# Patient Record
Sex: Male | Born: 1985 | Race: White | Hispanic: No | Marital: Married | Smoking: Former smoker
Health system: Southern US, Community
[De-identification: ages and names within clinical notes are randomized; demographics above are authoritative.]

---

## 2006-01-25 ENCOUNTER — Emergency Department: Payer: Self-pay | Admitting: Emergency Medicine

## 2007-08-09 IMAGING — CT CT ABD-PELV W/ CM
2 of 3 series · 15 of 42 positions shown, 19 images · non-contrast
Comparison: none

REASON FOR EXAM: abdominal and flank pain
COMMENTS:

[Series 2: appendicitis · axial · 0.65mm/px · z∈[-480,-90]mm · 12 of 150 slices shown, 16 images]
[im 13/150  soft-tissue]
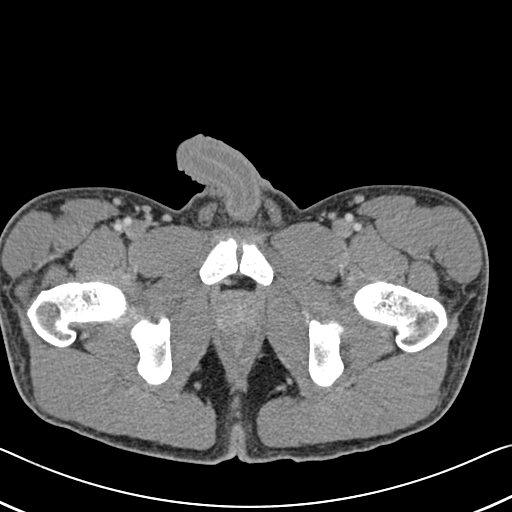
[im 13/150  bone]
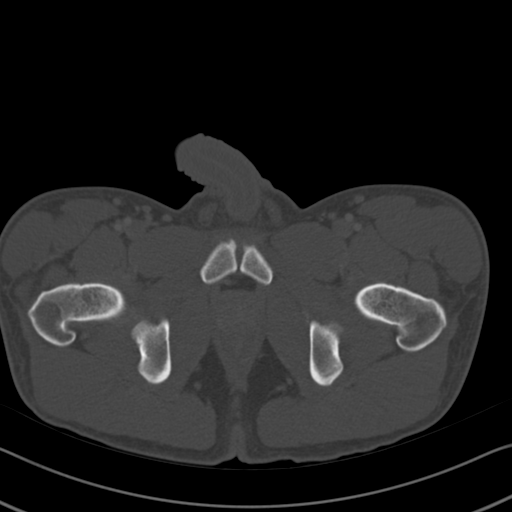
[im 26/150  soft-tissue]
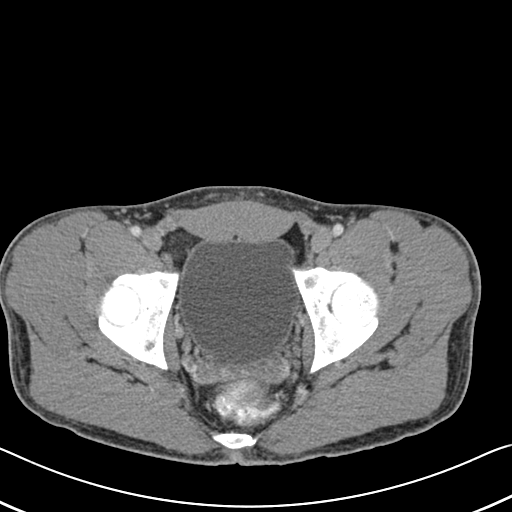
[im 39/150  soft-tissue]
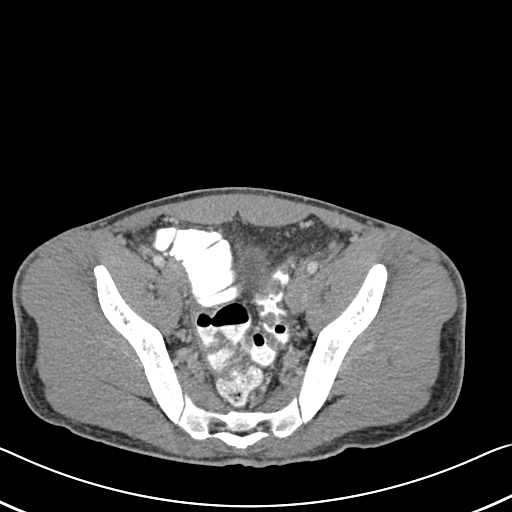
[im 52/150  soft-tissue]
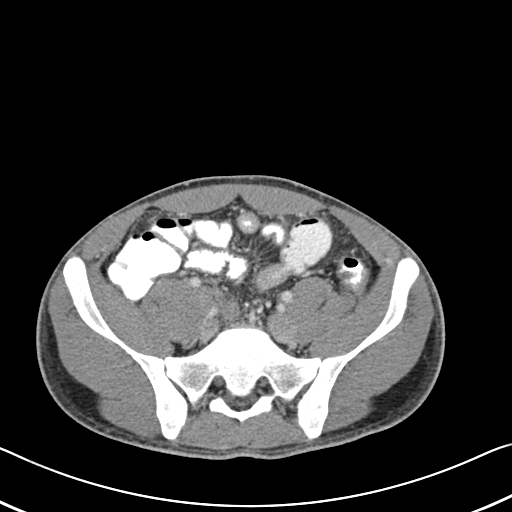
[im 65/150  soft-tissue]
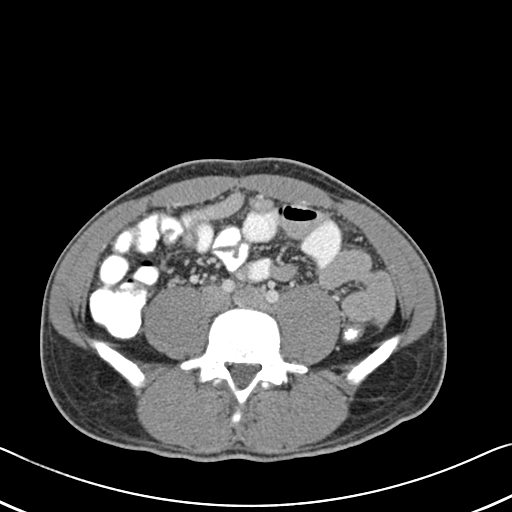
[im 85/150  soft-tissue]
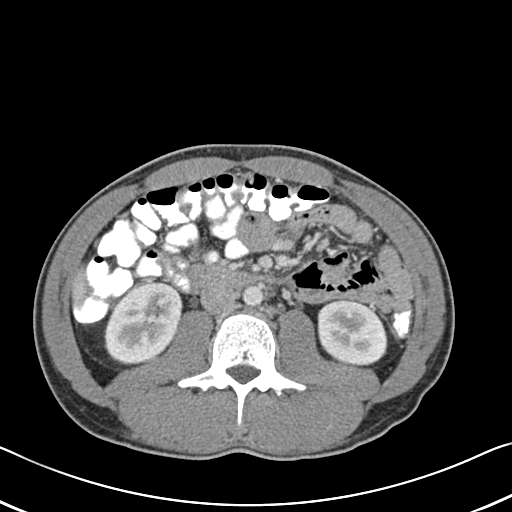
[im 98/150  soft-tissue]
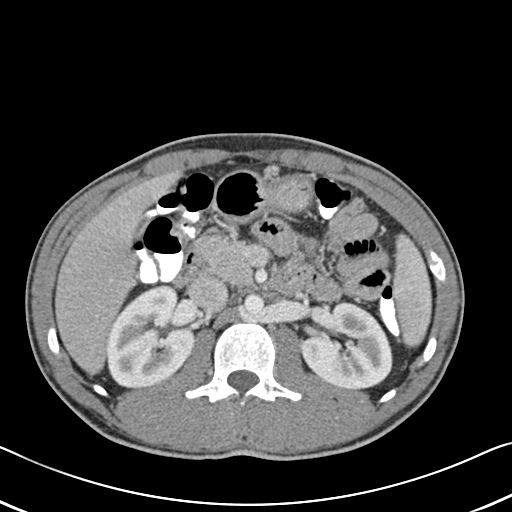
[im 111/150  soft-tissue]
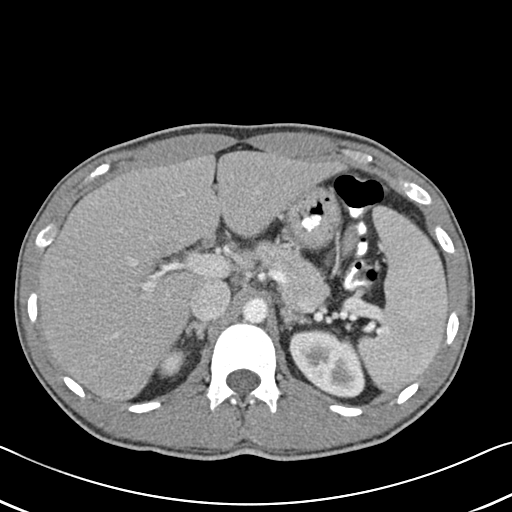
[im 124/150  soft-tissue]
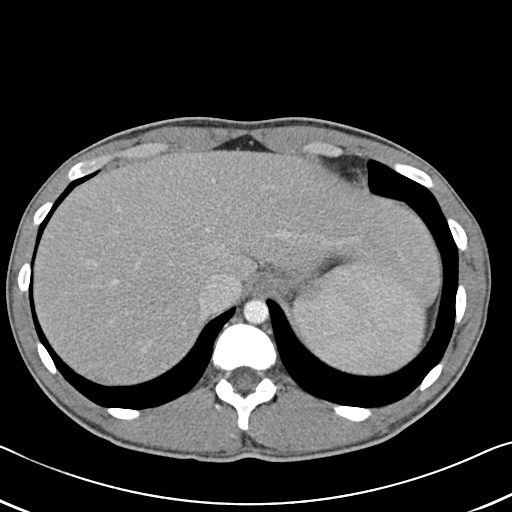
[im 124/150  lung]
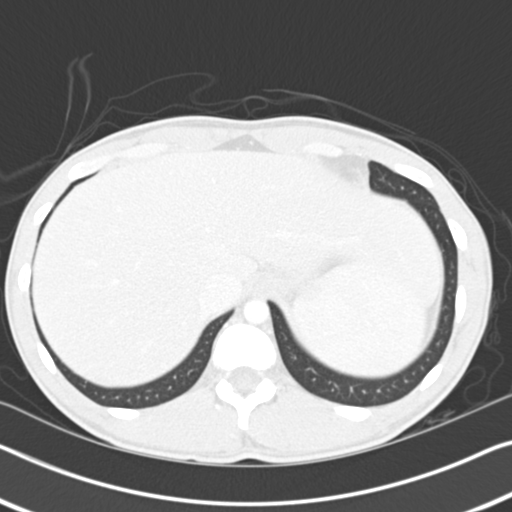
[im 124/150  bone]
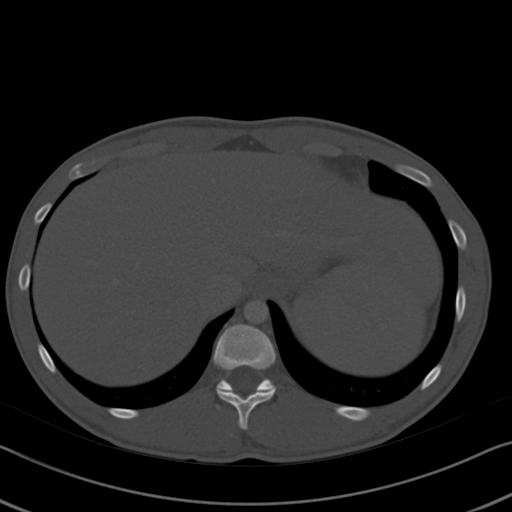
[im 130/150  lung]
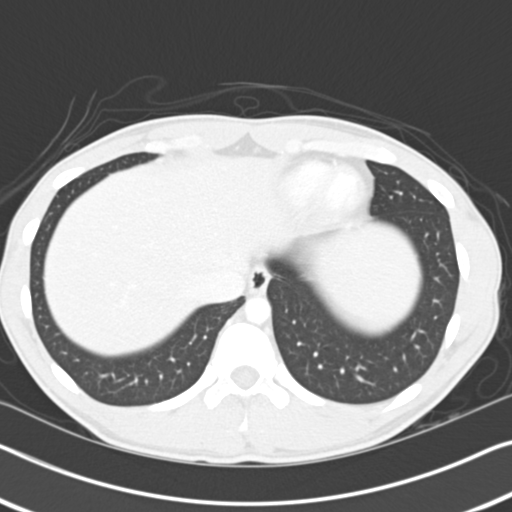
[im 137/150  soft-tissue]
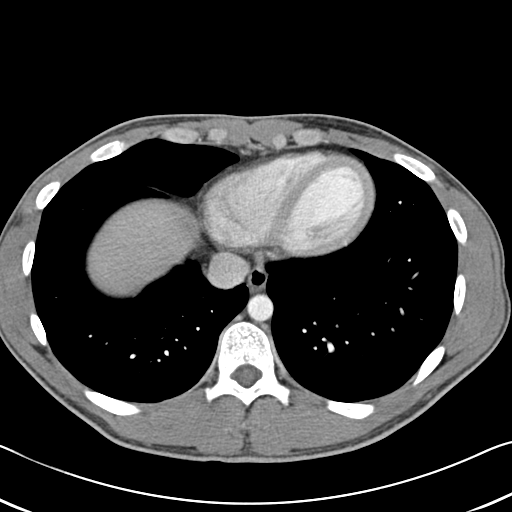
[im 137/150  lung]
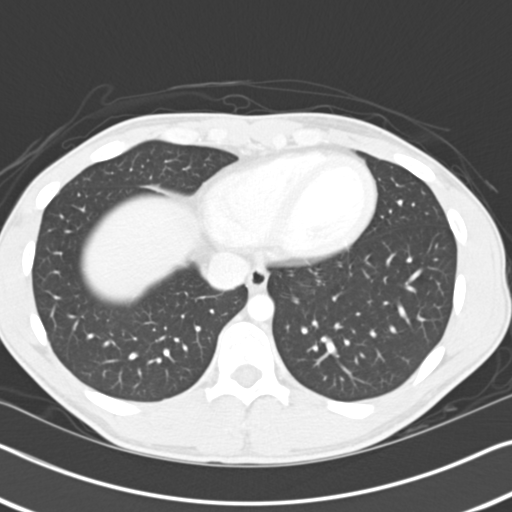
[im 143/150  lung]
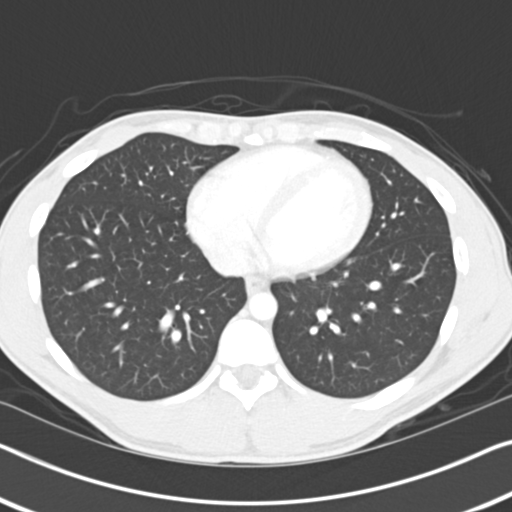

[Series 602: coronals · coronal · 0.90mm/px · 3 of 80 slices shown]
[im 27/80  soft-tissue]
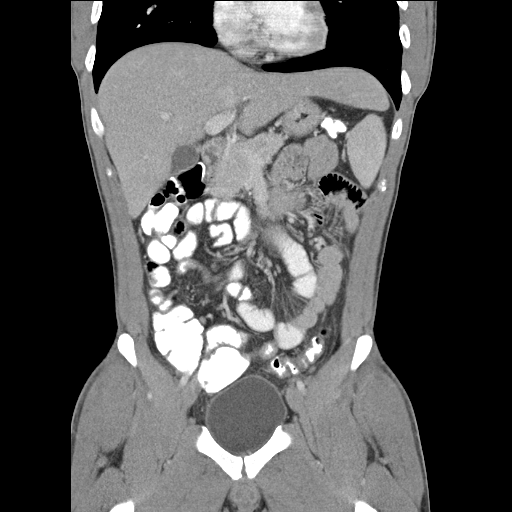
[im 36/80  soft-tissue]
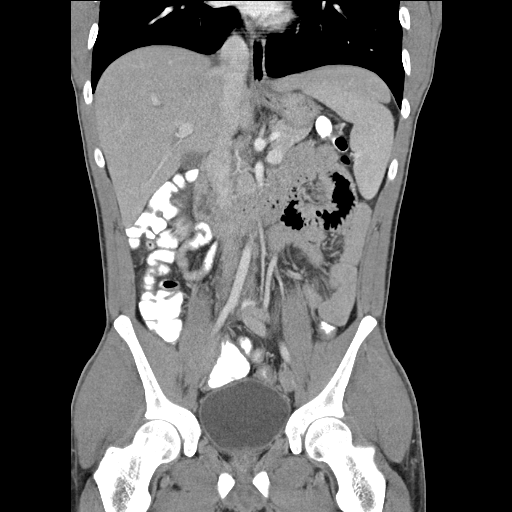
[im 44/80  soft-tissue]
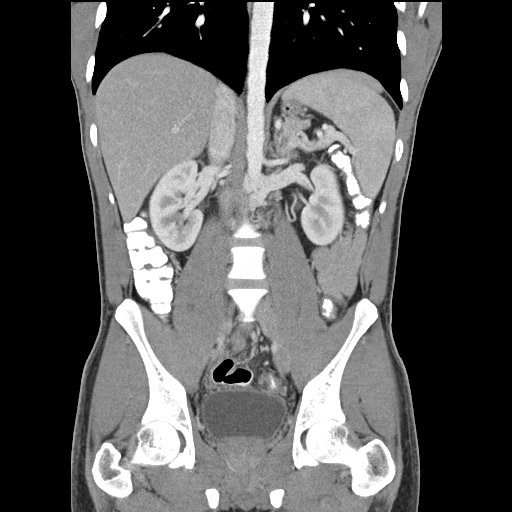

[15 of 42 positions shown; findings below may reference images not displayed]

PROCEDURE:     CT  - CT ABDOMEN / PELVIS  W  - January 25, 2006  [DATE]

RESULT:          Helical 3 mm sections were obtained from the lung bases
through the pubic symphysis status post intravenous administration of 100 ml
of Rsovue-HIV and oral contrast.

Evaluation of the lung bases demonstrates no gross abnormalities.

The liver, spleen, adrenals, pancreas, and kidneys are unremarkable.
Evaluation of the abdomen and pelvis demonstrates no evidence of free fluid
nor drainable loculated fluid collections.  The RIGHT lower quadrant
demonstrates no CT evidence to suggest sequela of appendicitis.  There does
appear to be areas of possible small bowel wall thickening which may
represent an element of enteritis.  No drainable loculated fluid
collections, masses nor adenopathy is demonstrated within the abdomen or
pelvis.
IMPRESSION: 1.     No CT evidence to suggest sequela of appendicitis.
2.     Findings possibly representing an element of enteritis as described
above.

Dr. Bayardo, of the emergency department, was informed of these findings at
the time of the initial interpretation.

## 2018-12-07 ENCOUNTER — Emergency Department (HOSPITAL_COMMUNITY)
Admission: EM | Admit: 2018-12-07 | Discharge: 2018-12-07 | Disposition: A | Discharge status: Home / Self Care | Payer: 59 | Attending: Emergency Medicine | Admitting: Emergency Medicine

## 2018-12-07 ENCOUNTER — Emergency Department (HOSPITAL_COMMUNITY): Payer: 59

## 2018-12-07 ENCOUNTER — Other Ambulatory Visit: Payer: Self-pay

## 2018-12-07 ENCOUNTER — Encounter (HOSPITAL_COMMUNITY): Payer: Self-pay

## 2018-12-07 DIAGNOSIS — J019 Acute sinusitis, unspecified: Secondary | ICD-10-CM | POA: Diagnosis not present

## 2018-12-07 DIAGNOSIS — H538 Other visual disturbances: Secondary | ICD-10-CM | POA: Diagnosis present

## 2018-12-07 LAB — COMPREHENSIVE METABOLIC PANEL
ALT: 25 U/L (ref 0–44)
AST: 24 U/L (ref 15–41)
Albumin: 4.6 g/dL (ref 3.5–5.0)
Alkaline Phosphatase: 41 U/L (ref 38–126)
Anion gap: 11 (ref 5–15)
BUN: 13 mg/dL (ref 6–20)
CO2: 22 mmol/L (ref 22–32)
Calcium: 9.5 mg/dL (ref 8.9–10.3)
Chloride: 106 mmol/L (ref 98–111)
Creatinine, Ser: 0.94 mg/dL (ref 0.61–1.24)
GFR calc Af Amer: >60 mL/min (ref >60)
GFR calc non Af Amer: >60 mL/min (ref >60)
Glucose, Bld: 118 mg/dL — ABNORMAL HIGH (ref 70–99)
Potassium: 3.9 mmol/L (ref 3.5–5.1)
Sodium: 139 mmol/L (ref 135–145)
Total Bilirubin: 0.8 mg/dL (ref 0.3–1.2)
Total Protein: 7.4 g/dL (ref 6.5–8.1)

## 2018-12-07 LAB — CBC
HCT: 45.1 % (ref 39.0–52.0)
Hemoglobin: 15.8 g/dL (ref 13.0–17.0)
MCH: 31.2 pg (ref 26.0–34.0)
MCHC: 35 g/dL (ref 30.0–36.0)
MCV: 89 fL (ref 80.0–100.0)
Platelets: 223 10*3/uL (ref 150–400)
RBC: 5.07 MIL/uL (ref 4.22–5.81)
RDW: 11.9 % (ref 11.5–15.5)
WBC: 7.4 10*3/uL (ref 4.0–10.5)
nRBC: 0 % (ref 0.0–0.2)

## 2018-12-07 MED ORDER — AMOXICILLIN-POT CLAVULANATE 875-125 MG PO TABS: 1.0000 | ORAL_TABLET | Freq: Two times a day (BID) | ORAL | 0 refills | Status: DC

## 2018-12-07 NOTE — ED Provider Notes (Signed)
[facility]PART[facility] Provider Note   CSN: [id number] Arrival date & time: 12/07/18  1354    History   Chief Complaint Chief Complaint  Patient presents with  . Anxiety  . Visual Field Change    HPI Travis Conner Travis Schappert. is a 33 y.o. male who presents to ED for left visual field deficit that occurred about 4hrs ago and lasted about 30 minutes.  States that he was driving when all of a sudden he felt anxious.  He began experiencing paresthesias, felt like there was "black" on his left peripheral vision.  This resolved spontaneously after 30 minutes.  He also had a tight band sensation in the back of his head.  States that "I would not have thought anything of it except my dad had a stroke when he was young."  Patient denies history of similar symptoms in the past, headache, vision changes currently.  Denies any neck pain, loss of consciousness, vomiting, changes to activity or appetite, diarrhea or urinary symptoms.     HPI  History reviewed. No pertinent past medical history.  There are no active problems to display for this patient.   History reviewed. No pertinent surgical history.      Home Medications    Prior to Admission medications   Medication Sig Start Date End Date Taking? Authorizing Provider  ibuprofen (ADVIL) 200 MG tablet Take 400 mg by mouth every 6 (six) hours as needed for moderate pain.   Yes [provider]  amoxicillin-clavulanate (AUGMENTIN) 875-125 MG tablet Take 1 tablet by mouth every 12 (twelve) hours. 12/07/18   Delia Heady, PA-C    Family History No family history on file.  Social History Social History   Tobacco Use  . Smoking status: Not on file  Substance Use Topics  . Alcohol use: Not on file  . Drug use: Not on file     Allergies   Patient has no allergy information on record.   Review of Systems Review of Systems  Constitutional: Negative for appetite change, chills and fever.  HENT:  Negative for ear pain, rhinorrhea, sneezing and sore throat.   Eyes: Positive for visual disturbance. Negative for photophobia.  Respiratory: Negative for cough, chest tightness, shortness of breath and wheezing.   Cardiovascular: Negative for chest pain and palpitations.  Gastrointestinal: Negative for abdominal pain, blood in stool, constipation, diarrhea, nausea and vomiting.  Genitourinary: Negative for dysuria, hematuria and urgency.  Musculoskeletal: Negative for myalgias.  Skin: Negative for rash.  Neurological: Negative for dizziness, weakness and light-headedness.     Physical Exam Updated Vital Signs BP (!) 148/86   Pulse 80   Temp 99 F (37.2 C) (Oral)   Resp 18   SpO2 96%   Physical Exam Vitals signs and nursing note reviewed.  Constitutional:      General: He is not in acute distress.    Appearance: He is well-developed.  HENT:     Head: Normocephalic and atraumatic.     Nose: Nose normal.  Eyes:     General: No scleral icterus.       Right eye: No discharge.        Left eye: No discharge.     Conjunctiva/sclera: Conjunctivae normal.     Pupils: Pupils are equal, round, and reactive to light.  Neck:     Musculoskeletal: Normal range of motion and neck supple.     Comments: No meningismus. Cardiovascular:     Rate and Rhythm: Normal rate  and regular rhythm.     Heart sounds: Normal heart sounds. No murmur. No friction rub. No gallop.   Pulmonary:     Effort: Pulmonary effort is normal. No respiratory distress.     Breath sounds: Normal breath sounds.  Abdominal:     General: Bowel sounds are normal. There is no distension.     Palpations: Abdomen is soft.     Tenderness: There is no abdominal tenderness. There is no guarding.  Musculoskeletal: Normal range of motion.  Skin:    General: Skin is warm and dry.     Findings: No rash.  Neurological:     General: No focal deficit present.     Mental Status: He is alert and oriented to person, place, and  time.     Cranial Nerves: No cranial nerve deficit.     Sensory: No sensory deficit.     Motor: No weakness or abnormal muscle tone.     Coordination: Coordination normal.     Comments: Pupils reactive. No facial asymmetry noted. Cranial nerves appear grossly intact. Sensation intact to light touch on face, BUE and BLE. Strength 5/5 in BUE and BLE. Normal finger to nose coordination bilaterally.      ED Treatments / Results  Labs (all labs ordered are listed, but only abnormal results are displayed) Labs Reviewed  COMPREHENSIVE METABOLIC PANEL - Abnormal; Notable for the following components:      Result Value   Glucose, Bld 118 (*)    All other components within normal limits  CBC    EKG None  Radiology Ct Head Wo Contrast  Result Date: 12/07/2018 CLINICAL DATA:  Transient peripheral vision blurriness on the left today. Increased anxiety. Increased stress lately. EXAM: CT HEAD WITHOUT CONTRAST TECHNIQUE: Contiguous axial images were obtained from the base of the skull through the vertex without intravenous contrast. COMPARISON:  None. FINDINGS: Brain: Normal appearing cerebral hemispheres and posterior fossa structures. Normal size and position of the ventricles. No intracranial hemorrhage, mass lesion or CT evidence of acute infarction. Vascular: No hyperdense vessel or unexpected calcification. Skull: Normal. Negative for fracture or focal lesion. Sinuses/Orbits: Mild left maxillary sinus mucosal thickening and retention cyst formation. Unremarkable orbits. Other: None. IMPRESSION: 1. No intracranial abnormality. 2. Mild chronic left maxillary sinusitis. Electronically Signed   By: Beckie SaltsSteven  Reid M.D.   On: 12/07/2018 18:41    Procedures Procedures (including critical care time)  Medications Ordered in ED Medications - No data to display   Initial Impression / Assessment and Plan / ED Course  I have reviewed the triage vital signs and the nursing notes.  Pertinent labs &  imaging results that were available during my care of the patient were reviewed by me and considered in my medical decision making (see chart for details).        33 year old male presents to ED for visual field disturbance that occurred prior to arrival.  States that he had brief loss of vision in his left peripheral eye as well as some anxiety symptoms.  Symptoms resolved spontaneously after 30 minutes.  He does admit to increased stress dealing with his children and work.  On my exam he is overall well-appearing.  No deficits neurological exam noted.  No changes to sensation or visual field deficits noted.  He is otherwise healthy, no alcohol or drug use.  Lab work including CBC, BMP is unremarkable.  CT of the head is negative for acute abnormality, does show left maxillary sinusitis which he states  has been bothering him.  Due to his absence of symptoms now, low risk for CVA suspect that symptoms were due to anxiety.  There are no headache characteristics that are lateralizing or concerning for increased ICP, infectious or vascular cause of his symptoms.   Will treat with antibiotics, have him follow-up with PCP and return for worsening symptoms.  Patient is hemodynamically stable, in NAD, and able to ambulate in the ED. Evaluation does not show pathology that would require ongoing emergent intervention or inpatient treatment. I explained the diagnosis to the patient. Pain has been managed and has no complaints prior to discharge. Patient is comfortable with above plan and is stable for discharge at this time. All questions were answered prior to disposition. Strict return precautions for returning to the ED were discussed. Encouraged follow up with PCP.   An After Visit Summary was printed and given to the patient.   Portions of this note were generated with Scientist, clinical (histocompatibility and immunogenetics)Dragon dictation software. Dictation errors may occur despite best attempts at proofreading.  Final Clinical Impressions(s) / ED Diagnoses    Final diagnoses:  Acute non-recurrent sinusitis, unspecified location    ED Discharge Orders         Ordered    amoxicillin-clavulanate (AUGMENTIN) 875-125 MG tablet  Every 12 hours     12/07/18 1851           Dietrich PatesKhatri, Kidus Delman, PA-C 12/07/18 1854    Geoffery Lyonselo, Douglas, MD 12/07/18 [phone]

## 2018-12-07 NOTE — ED Notes (Signed)
Patient verbalizes understanding of discharge instructions. Opportunity for questioning and answers were provided. Armband removed by staff, pt discharged from ED.  

## 2018-12-07 NOTE — ED Triage Notes (Signed)
Pt reports around 11am while driving he felt really anxious, pt has had increased stress with work lately. Pt also felt like his left eye peripheral vision was blurry. Symptoms have now resolved, pt very anxious in triage, tearful. Pt states his father had a stroke at a young age. Pt a.o, no neuro deficits noted. VSS

## 2018-12-07 NOTE — Discharge Instructions (Addendum)
Begin taking the antibiotics as directed. Return to the ED if you start to experience worsening symptoms, increased vision changes, vomiting, numbness in arms or legs.

## 2019-05-13 ENCOUNTER — Other Ambulatory Visit: Payer: Self-pay

## 2019-05-13 ENCOUNTER — Ambulatory Visit: Payer: 59 | Admitting: Primary Care

## 2019-05-13 ENCOUNTER — Encounter: Payer: Self-pay | Admitting: Primary Care

## 2019-05-13 VITALS — BP 124/78 | HR 82 | Temp 97.1°F | Ht 65.0 in | Wt 149.5 lb

## 2019-05-13 DIAGNOSIS — F419 Anxiety disorder, unspecified: Secondary | ICD-10-CM | POA: Diagnosis not present

## 2019-05-13 NOTE — Progress Notes (Signed)
Subjective:    Patient ID: Travis Donath Talbert Cage., male    DOB: [birth year], 33 y.o.   MRN: [id number]  HPI  This visit occurred during the SARS-CoV-2 public health emergency.  Safety protocols were in place, including screening questions prior to the visit, additional usage of staff PPE, and extensive cleaning of exam room while observing appropriate contact time as indicated for disinfecting solutions.   Travis Conner is a 33 year old male who presents today to establish care and discuss the problems mentioned below. Will obtain/review records.  1) Anxiety: Symptoms include "uncertainty", second guessing things, mind racing thoughts, worry, some jittery feeling. Symptoms began in July 2020 and have been intermittent since. Symptoms "hit" once weekly or once every other week and range in intensity, will last for minutes to 1-2 hours. He has no prior history of anxiety. He denies increased stress at work. Denies depression.   GAD 7 score of 5 and PHQ 9 score of 1 today.   BP Readings from Last 3 Encounters:  05/13/19 124/78  12/07/18 (!) 148/86   2) Family History of Celiac Disease: Diagnosed in his sister. He eats gluten products and doesn't seem to have problems. Denies GI upset, nausea, vomiting.   Review of Systems  Gastrointestinal: Negative for abdominal pain, nausea and vomiting.  Psychiatric/Behavioral:       See HPI       History reviewed. No pertinent past medical history.   Social History   Socioeconomic History  . Marital status: Married    Spouse name: Not on file  . Number of children: Not on file  . Years of education: Not on file  . Highest education level: Not on file  Occupational History  . Not on file  Tobacco Use  . Smoking status: Former Games developer  . Smokeless tobacco: Current User    Types: Snuff  Substance and Sexual Activity  . Alcohol use: Yes  . Drug use: Not on file  . Sexual activity: Not on file  Other Topics Concern  . Not on file  Social  History Narrative   Married.      Social Determinants of Health   Financial Resource Strain:   . Difficulty of Paying Living Expenses: Not on file  Food Insecurity:   . Worried About Programme researcher, broadcasting/film/video in the Last Year: Not on file  . Ran Out of Food in the Last Year: Not on file  Transportation Needs:   . Lack of Transportation (Medical): Not on file  . Lack of Transportation (Non-Medical): Not on file  Physical Activity:   . Days of Exercise per Week: Not on file  . Minutes of Exercise per Session: Not on file  Stress:   . Feeling of Stress : Not on file  Social Connections:   . Frequency of Communication with Friends and Family: Not on file  . Frequency of Social Gatherings with Friends and Family: Not on file  . Attends Religious Services: Not on file  . Active Member of Clubs or Organizations: Not on file  . Attends Banker Meetings: Not on file  . Marital Status: Not on file  Intimate Partner Violence:   . Fear of Current or Ex-Partner: Not on file  . Emotionally Abused: Not on file  . Physically Abused: Not on file  . Sexually Abused: Not on file    History reviewed. No pertinent surgical history.  Family History  Problem Relation Age of Onset  . Stroke  Father   . Asthma Sister   . Celiac disease Sister   . Heart attack Maternal Grandmother   . Heart attack Maternal Grandfather   . Diabetes Paternal Grandmother   . Colon cancer Paternal Grandfather     No Known Allergies  No current outpatient medications on file prior to visit.   No current facility-administered medications on file prior to visit.    BP 124/78   Pulse 82   Temp (!) 97.1 F (36.2 C) (Temporal)   Ht 5\' 5"  (1.651 m)   Wt 149 lb 8 oz (67.8 kg)   SpO2 98%   BMI 24.88 kg/m    Objective:   Physical Exam  Constitutional: He appears well-nourished.  Cardiovascular: Normal rate and regular rhythm.  Respiratory: Effort normal and breath sounds normal.  Musculoskeletal:       Cervical back: Neck supple.  Skin: Skin is warm and dry.  Psychiatric: He has a normal mood and affect.           Assessment & Plan:

## 2019-05-13 NOTE — Patient Instructions (Signed)
You will be contacted regarding your referral to therapy.  Please let us know if you have not been contacted within two weeks.   It was a pleasure to meet you today! Please don't hesitate to call or message me with any questions. Welcome to [facility]!   

## 2019-05-13 NOTE — Assessment & Plan Note (Signed)
Intermittent since July 2020, seems to be having mild to moderate panic attacks. Discussed treatment options which mainly include therapy vs PRN hydroxyzine.  He opts for therapy which is a very reasonable start. Referral placed. Denies depression.

## 2019-06-01 ENCOUNTER — Other Ambulatory Visit: Payer: Self-pay | Admitting: Primary Care

## 2019-06-01 DIAGNOSIS — Z0000 Encounter for general adult medical examination without abnormal findings: Secondary | ICD-10-CM

## 2019-06-07 ENCOUNTER — Telehealth: Payer: Self-pay

## 2019-06-07 NOTE — Telephone Encounter (Signed)
LVM to call clinic, pt needs COVID screen and back lab info 1.12.2021 TLJ  

## 2019-06-08 ENCOUNTER — Other Ambulatory Visit: Payer: Self-pay

## 2019-06-08 ENCOUNTER — Other Ambulatory Visit (INDEPENDENT_AMBULATORY_CARE_PROVIDER_SITE_OTHER): Payer: 59

## 2019-06-08 DIAGNOSIS — Z0000 Encounter for general adult medical examination without abnormal findings: Secondary | ICD-10-CM | POA: Diagnosis not present

## 2019-06-08 LAB — COMPREHENSIVE METABOLIC PANEL
ALT: 19 U/L (ref 0–53)
AST: 18 U/L (ref 0–37)
Albumin: 4.7 g/dL (ref 3.5–5.2)
Alkaline Phosphatase: 39 U/L (ref 39–117)
BUN: 17 mg/dL (ref 6–23)
CO2: 30 mEq/L (ref 19–32)
Calcium: 9.5 mg/dL (ref 8.4–10.5)
Chloride: 103 mEq/L (ref 96–112)
Creatinine, Ser: 0.98 mg/dL (ref 0.40–1.50)
GFR: 86.88 mL/min (ref >60.00)
Glucose, Bld: 103 mg/dL — ABNORMAL HIGH (ref 70–99)
Potassium: 3.9 mEq/L (ref 3.5–5.1)
Sodium: 140 mEq/L (ref 135–145)
Total Bilirubin: 0.6 mg/dL (ref 0.2–1.2)
Total Protein: 7.3 g/dL (ref 6.0–8.3)

## 2019-06-08 LAB — HEMOGLOBIN A1C: Hgb A1c MFr Bld: 5.1 % (ref 4.6–6.5)

## 2019-06-08 LAB — LIPID PANEL
Cholesterol: 207 mg/dL — ABNORMAL HIGH (ref 0–200)
HDL: 52.9 mg/dL (ref >39.00)
LDL Cholesterol: 139 mg/dL — ABNORMAL HIGH (ref 0–99)
NonHDL: 154.04
Total CHOL/HDL Ratio: 4
Triglycerides: 76 mg/dL (ref 0.0–149.0)
VLDL: 15.2 mg/dL (ref 0.0–40.0)

## 2019-06-15 ENCOUNTER — Encounter: Payer: Self-pay | Admitting: Primary Care

## 2019-06-15 ENCOUNTER — Ambulatory Visit (INDEPENDENT_AMBULATORY_CARE_PROVIDER_SITE_OTHER): Payer: 59 | Admitting: Primary Care

## 2019-06-15 ENCOUNTER — Other Ambulatory Visit: Payer: Self-pay

## 2019-06-15 VITALS — BP 120/76 | HR 85 | Temp 97.1°F | Ht 65.0 in | Wt 147.5 lb

## 2019-06-15 DIAGNOSIS — Z8379 Family history of other diseases of the digestive system: Secondary | ICD-10-CM

## 2019-06-15 DIAGNOSIS — Z0000 Encounter for general adult medical examination without abnormal findings: Secondary | ICD-10-CM | POA: Diagnosis not present

## 2019-06-15 DIAGNOSIS — R002 Palpitations: Secondary | ICD-10-CM

## 2019-06-15 DIAGNOSIS — Z23 Encounter for immunization: Secondary | ICD-10-CM | POA: Diagnosis not present

## 2019-06-15 DIAGNOSIS — E785 Hyperlipidemia, unspecified: Secondary | ICD-10-CM

## 2019-06-15 DIAGNOSIS — F419 Anxiety disorder, unspecified: Secondary | ICD-10-CM | POA: Diagnosis not present

## 2019-06-15 NOTE — Patient Instructions (Addendum)
Stop by the lab prior to leaving today. I will notify you of your results once received.   Follow up with the therapist as scheduled.  Continue exercising. You should be getting 150 minutes of moderate intensity exercise weekly.  Continue to work on a healthy diet. Ensure you are consuming 64 ounces of water daily.  Please update me as disuseded.  It was a pleasure to see you today!   Preventive Care 52-36 Years Old, Male Preventive care refers to lifestyle choices and visits with your health care provider that can promote health and wellness. This includes:  A yearly physical exam. This is also called an annual well check.  Regular dental and eye exams.  Immunizations.  Screening for certain conditions.  Healthy lifestyle choices, such as eating a healthy diet, getting regular exercise, not using drugs or products that contain nicotine and tobacco, and limiting alcohol use. What can I expect for my preventive care visit? Physical exam Your health care provider will check:  Height and weight. These may be used to calculate body mass index (BMI), which is a measurement that tells if you are at a healthy weight.  Heart rate and blood pressure.  Your skin for abnormal spots. Counseling Your health care provider may ask you questions about:  Alcohol, tobacco, and drug use.  Emotional well-being.  Home and relationship well-being.  Sexual activity.  Eating habits.  Work and work Statistician. What immunizations do I need?  Influenza (flu) vaccine  This is recommended every year. Tetanus, diphtheria, and pertussis (Tdap) vaccine  You may need a Td booster every 10 years. Varicella (chickenpox) vaccine  You may need this vaccine if you have not already been vaccinated. Human papillomavirus (HPV) vaccine  If recommended by your health care provider, you may need three doses over 6 months. Measles, mumps, and rubella (MMR) vaccine  You may need at least one dose  of MMR. You may also need a second dose. Meningococcal conjugate (MenACWY) vaccine  One dose is recommended if you are 9-67 years old and a Market researcher living in a residence hall, or if you have one of several medical conditions. You may also need additional booster doses. Pneumococcal conjugate (PCV13) vaccine  You may need this if you have certain conditions and were not previously vaccinated. Pneumococcal polysaccharide (PPSV23) vaccine  You may need one or two doses if you smoke cigarettes or if you have certain conditions. Hepatitis A vaccine  You may need this if you have certain conditions or if you travel or work in places where you may be exposed to hepatitis A. Hepatitis B vaccine  You may need this if you have certain conditions or if you travel or work in places where you may be exposed to hepatitis B. Haemophilus influenzae type b (Hib) vaccine  You may need this if you have certain risk factors. You may receive vaccines as individual doses or as more than one vaccine together in one shot (combination vaccines). Talk with your health care provider about the risks and benefits of combination vaccines. What tests do I need? Blood tests  Lipid and cholesterol levels. These may be checked every 5 years starting at age 64.  Hepatitis C test.  Hepatitis B test. Screening   Diabetes screening. This is done by checking your blood sugar (glucose) after you have not eaten for a while (fasting).  Sexually transmitted disease (STD) testing. Talk with your health care provider about your test results, treatment options, and if  necessary, the need for more tests. Follow these instructions at home: Eating and drinking   Eat a diet that includes fresh fruits and vegetables, whole grains, lean protein, and low-fat dairy products.  Take vitamin and mineral supplements as recommended by your health care provider.  Do not drink alcohol if your health care provider  tells you not to drink.  If you drink alcohol: ? Limit how much you have to 0-2 drinks a day. ? Be aware of how much alcohol is in your drink. In the [place] one drink equals one 12 oz bottle of beer (355 mL), one 5 oz glass of wine (148 mL), or one 1 oz glass of hard liquor (44 mL). Lifestyle  Take daily care of your teeth and gums.  Stay active. Exercise for at least 30 minutes on 5 or more days each week.  Do not use any products that contain nicotine or tobacco, such as cigarettes, e-cigarettes, and chewing tobacco. If you need help quitting, ask your health care provider.  If you are sexually active, practice safe sex. Use a condom or other form of protection to prevent STIs (sexually transmitted infections). What's next?  Go to your health care provider once a year for a well check visit.  Ask your health care provider how often you should have your eyes and teeth checked.  Stay up to date on all vaccines. This information is not intended to replace advice given to you by your health care provider. Make sure you discuss any questions you have with your health care provider. Document Revised: 05/06/2018 Document Reviewed: 05/06/2018 Elsevier Patient Education  2020 [employer].

## 2019-06-15 NOTE — Assessment & Plan Note (Signed)
Tetanus due, provided. Declines influenza vaccination. Encouraged a healthy diet, regular exercise. Exam today benign. Labs reviewed.

## 2019-06-15 NOTE — Assessment & Plan Note (Signed)
Present in his sister. He is wanting testing, has no difficulty with gluten products. Testing pending.

## 2019-06-15 NOTE — Addendum Note (Signed)
Addended by: Tawnya Crook on: 06/15/2019 04:29 PM   Modules accepted: Orders

## 2019-06-15 NOTE — Assessment & Plan Note (Signed)
Suspect more anxiety related as they occur during mind racing thoughts.  Check TSH, CBC today. ECG today with NSR rate of 78, no PAC/PVC, acute St changes. No prior ECG to compare.  Continue with therapy for anxiety treatment. Consider cardiology for holter monitor evaluation.

## 2019-06-15 NOTE — Progress Notes (Signed)
Subjective:    Patient ID: Travis Conner Travis Conner., male    DOB: 07-16-1985, 34 y.o.   MRN: [id number]  HPI  This visit occurred during the SARS-CoV-2 public health emergency.  Safety protocols were in place, including screening questions prior to the visit, additional usage of staff PPE, and extensive cleaning of exam room while observing appropriate contact time as indicated for disinfecting solutions.   [name] is a 34 year old male who presents today for complete physical.  He would also like to discuss anxiety. Symptoms include substernal chest pressure. His pressure is intermittent which feels like a "squeezing". He'll then notice an "adrenaline rush". He has to avoid high intensity work outs he will notice palpitations, lightheadedness, shortness of breath, and anxiety. He denies syncope, near syncope. Symptoms initially began in Summer 2020, evaluated in the ED. He has chronic anxiety on a daily basis.   He would also like a Celiac Panel as his sister has Celiac disease.   Immunizations: -Tetanus: Unsure, over 10 years. -Influenza: Declines   Diet: He endorses a fair diet. Exercise: He is exercising some days weekly  Eye exam: No recent exam Dental exam: Completes semi-annually  BP Readings from Last 3 Encounters:  06/15/19 120/76  05/13/19 124/78  12/07/18 (!) 148/86       Review of Systems  Constitutional: Negative for unexpected weight change.  HENT: Negative for rhinorrhea.   Respiratory: Negative for cough and shortness of breath.   Cardiovascular: Positive for palpitations. Negative for chest pain.       See HPI  Gastrointestinal: Negative for abdominal pain, constipation and diarrhea.  Genitourinary: Negative for difficulty urinating.  Musculoskeletal: Negative for arthralgias and myalgias.  Skin: Negative for rash.  Allergic/Immunologic: Negative for environmental allergies.  Neurological: Negative for dizziness, syncope, numbness and headaches.    Psychiatric/Behavioral: The patient is nervous/anxious.        No past medical history on file.   Social History   Socioeconomic History  . Marital status: Married    Spouse name: Not on file  . Number of children: Not on file  . Years of education: Not on file  . Highest education level: Not on file  Occupational History  . Not on file  Tobacco Use  . Smoking status: Former Research scientist (life sciences)  . Smokeless tobacco: Current User    Types: Snuff  Substance and Sexual Activity  . Alcohol use: Yes  . Drug use: Not on file  . Sexual activity: Not on file  Other Topics Concern  . Not on file  Social History Narrative   Married.      Social Determinants of Health   Financial Resource Strain:   . Difficulty of Paying Living Expenses: Not on file  Food Insecurity:   . Worried About Charity fundraiser in the Last Year: Not on file  . Ran Out of Food in the Last Year: Not on file  Transportation Needs:   . Lack of Transportation (Medical): Not on file  . Lack of Transportation (Non-Medical): Not on file  Physical Activity:   . Days of Exercise per Week: Not on file  . Minutes of Exercise per Session: Not on file  Stress:   . Feeling of Stress : Not on file  Social Connections:   . Frequency of Communication with Friends and Family: Not on file  . Frequency of Social Gatherings with Friends and Family: Not on file  . Attends Religious Services: Not on file  .  Active Member of Clubs or Organizations: Not on file  . Attends Banker Meetings: Not on file  . Marital Status: Not on file  Intimate Partner Violence:   . Fear of Current or Ex-Partner: Not on file  . Emotionally Abused: Not on file  . Physically Abused: Not on file  . Sexually Abused: Not on file    No past surgical history on file.  Family History  Problem Relation Age of Onset  . Stroke Father   . Asthma Sister   . Celiac disease Sister   . Heart attack Maternal Grandmother   . Heart attack  Maternal Grandfather   . Diabetes Paternal Grandmother   . Colon cancer Paternal Grandfather     No Known Allergies  No current outpatient medications on file prior to visit.   No current facility-administered medications on file prior to visit.    BP 120/76   Pulse 85   Temp (!) 97.1 F (36.2 C) (Temporal)   Ht 5\' 5"  (1.651 m)   Wt 147 lb 8 oz (66.9 kg)   SpO2 98%   BMI 24.55 kg/m    Objective:   Physical Exam  Constitutional: He is oriented to person, place, and time. He appears well-nourished.  HENT:  Right Ear: Tympanic membrane and ear canal normal.  Left Ear: Tympanic membrane and ear canal normal.  Mouth/Throat: Oropharynx is clear and moist.  Eyes: Pupils are equal, round, and reactive to light. EOM are normal.  Cardiovascular: Normal rate and regular rhythm.  Respiratory: Effort normal and breath sounds normal.  GI: Soft. Bowel sounds are normal. There is no abdominal tenderness.  Musculoskeletal:        General: Normal range of motion.     Cervical back: Neck supple.  Neurological: He is alert and oriented to person, place, and time. No cranial nerve deficit.  Reflex Scores:      Patellar reflexes are 2+ on the right side and 2+ on the left side. Skin: Skin is warm and dry.  Psychiatric: He has a normal mood and affect.           Assessment & Plan:

## 2019-06-15 NOTE — Assessment & Plan Note (Addendum)
Chronic and continuous, suspect this to be contributing to symptoms in HPI. He has an appointment with therapy in 2 weeks, continues to decline medication treatment.  ECG today with NSR rate of 78, no PAC/PVC, acute St changes. No prior ECG to compare.

## 2019-06-15 NOTE — Assessment & Plan Note (Signed)
LDL of 139. Given his body habitus and healthy lifestyle this is likely familial.   Continue to work on healthy lifestyle. Continue to monitor.

## 2019-06-16 LAB — CBC
HCT: 44 % (ref 39.0–52.0)
Hemoglobin: 15.1 g/dL (ref 13.0–17.0)
MCHC: 34.2 g/dL (ref 30.0–36.0)
MCV: 90.5 fl (ref 78.0–100.0)
Platelets: 203 10*3/uL (ref 150.0–400.0)
RBC: 4.87 Mil/uL (ref 4.22–5.81)
RDW: 12.7 % (ref 11.5–15.5)
WBC: 6 10*3/uL (ref 4.0–10.5)

## 2019-06-16 LAB — TSH: TSH: 1.18 u[IU]/mL (ref 0.35–4.50)

## 2019-06-20 LAB — CELIAC PNL 2 RFLX ENDOMYSIAL AB TTR
(tTG) Ab, IgA: <1 U/mL
(tTG) Ab, IgG: 4 U/mL
Endomysial Ab IgA: NEGATIVE
Gliadin IgA: 13 U (ref <20)
Gliadin IgG: 4 U (ref <20)
Immunoglobulin A: 254 mg/dL (ref 47–310)

## 2019-06-30 ENCOUNTER — Ambulatory Visit (INDEPENDENT_AMBULATORY_CARE_PROVIDER_SITE_OTHER): Payer: 59 | Admitting: Psychology

## 2019-06-30 DIAGNOSIS — F411 Generalized anxiety disorder: Secondary | ICD-10-CM | POA: Diagnosis not present

## 2019-07-04 DIAGNOSIS — F419 Anxiety disorder, unspecified: Secondary | ICD-10-CM

## 2019-07-04 MED ORDER — CITALOPRAM HYDROBROMIDE 20 MG PO TABS: 20.0000 mg | ORAL_TABLET | Freq: Every day | ORAL | 1 refills | Status: DC

## 2019-07-08 ENCOUNTER — Ambulatory Visit (INDEPENDENT_AMBULATORY_CARE_PROVIDER_SITE_OTHER): Payer: 59 | Admitting: Psychology

## 2019-07-08 DIAGNOSIS — F411 Generalized anxiety disorder: Secondary | ICD-10-CM | POA: Diagnosis not present

## 2019-07-22 ENCOUNTER — Ambulatory Visit (INDEPENDENT_AMBULATORY_CARE_PROVIDER_SITE_OTHER): Payer: 59 | Admitting: Psychology

## 2019-07-22 DIAGNOSIS — F411 Generalized anxiety disorder: Secondary | ICD-10-CM

## 2019-08-04 ENCOUNTER — Ambulatory Visit (INDEPENDENT_AMBULATORY_CARE_PROVIDER_SITE_OTHER): Payer: 59 | Admitting: Psychology

## 2019-08-04 DIAGNOSIS — F411 Generalized anxiety disorder: Secondary | ICD-10-CM

## 2019-08-18 ENCOUNTER — Other Ambulatory Visit: Payer: Self-pay

## 2019-08-18 ENCOUNTER — Ambulatory Visit (INDEPENDENT_AMBULATORY_CARE_PROVIDER_SITE_OTHER): Payer: 59 | Admitting: Primary Care

## 2019-08-18 ENCOUNTER — Encounter: Payer: Self-pay | Admitting: Primary Care

## 2019-08-18 DIAGNOSIS — F419 Anxiety disorder, unspecified: Secondary | ICD-10-CM | POA: Diagnosis not present

## 2019-08-18 DIAGNOSIS — Z8379 Family history of other diseases of the digestive system: Secondary | ICD-10-CM

## 2019-08-18 DIAGNOSIS — R002 Palpitations: Secondary | ICD-10-CM | POA: Diagnosis not present

## 2019-08-18 MED ORDER — CITALOPRAM HYDROBROMIDE 20 MG PO TABS: 20.0000 mg | ORAL_TABLET | Freq: Every day | ORAL | 3 refills | Status: DC

## 2019-08-18 NOTE — Assessment & Plan Note (Signed)
Labs negative for Celiac.

## 2019-08-18 NOTE — Progress Notes (Signed)
Subjective:    Patient ID: Travis Martens Annetta Maw., male    DOB: 1985-06-16, 34 y.o.   MRN: [id number]  HPI  This visit occurred during the SARS-CoV-2 public health emergency.  Safety protocols were in place, including screening questions prior to the visit, additional usage of staff PPE, and extensive cleaning of exam room while observing appropriate contact time as indicated for disinfecting solutions.   [name] is a 34 year old male who presents today for follow up of anxiety.   He was last evaluated in late January 2021 for symptoms of chest pressure, "adrenaline rush", palpitations. Work up in the office was without concern for cardiac cause. He agreed to treatment for anxiety so we initiated citalopram 20 mg.   Since his last visit he's doing better. He did have headaches for several weeks, these have resolved. Positive effects from citalopram include feeling less anxious, doesn't have the "adrenaline rush" feeling, is more active at home, feeling less stressed at work.   He's continued to meet with his therapist, this has been going well overall. He denies SI/HI.   Review of Systems  Gastrointestinal: Negative for nausea.  Neurological: Negative for headaches.  Psychiatric/Behavioral: The patient is not nervous/anxious.        See HPI       No past medical history on file.   Social History   Socioeconomic History  . Marital status: Married    Spouse name: Not on file  . Number of children: Not on file  . Years of education: Not on file  . Highest education level: Not on file  Occupational History  . Not on file  Tobacco Use  . Smoking status: Former Research scientist (life sciences)  . Smokeless tobacco: Current User    Types: Snuff  Substance and Sexual Activity  . Alcohol use: Yes  . Drug use: Not on file  . Sexual activity: Not on file  Other Topics Concern  . Not on file  Social History Narrative   Married.      Social Determinants of Health   Financial Resource Strain:   .  Difficulty of Paying Living Expenses:   Food Insecurity:   . Worried About Charity fundraiser in the Last Year:   . Arboriculturist in the Last Year:   Transportation Needs:   . Film/video editor (Medical):   Marland Kitchen Lack of Transportation (Non-Medical):   Physical Activity:   . Days of Exercise per Week:   . Minutes of Exercise per Session:   Stress:   . Feeling of Stress :   Social Connections:   . Frequency of Communication with Friends and Family:   . Frequency of Social Gatherings with Friends and Family:   . Attends Religious Services:   . Active Member of Clubs or Organizations:   . Attends Archivist Meetings:   Marland Kitchen Marital Status:   Intimate Partner Violence:   . Fear of Current or Ex-Partner:   . Emotionally Abused:   Marland Kitchen Physically Abused:   . Sexually Abused:     No past surgical history on file.  Family History  Problem Relation Age of Onset  . Stroke Father   . Asthma Sister   . Celiac disease Sister   . Heart attack Maternal Grandmother   . Heart attack Maternal Grandfather   . Diabetes Paternal Grandmother   . Colon cancer Paternal Grandfather     No Known Allergies  No current outpatient medications on  file prior to visit.   No current facility-administered medications on file prior to visit.    BP 124/70   Pulse 68   Temp (!) 97 F (36.1 C) (Temporal)   Ht 5\' 5"  (1.651 m)   Wt 146 lb 12 oz (66.6 kg)   SpO2 98%   BMI 24.42 kg/m    Objective:   Physical Exam  Constitutional: He appears well-nourished.  Cardiovascular: Normal rate and regular rhythm.  Respiratory: Effort normal and breath sounds normal.  Musculoskeletal:     Cervical back: Neck supple.  Skin: Skin is warm and dry.  Psychiatric: He has a normal mood and affect.           Assessment & Plan:

## 2019-08-18 NOTE — Patient Instructions (Signed)
Continue taking citalopram 20 mg daily for anxiety. I recommend you take this for at LEAST 6 months.  Please notify me if you are considering weaning off.  It was a pleasure to see you today!

## 2019-08-18 NOTE — Assessment & Plan Note (Signed)
Significantly improved with anxiety treatment, continue to monitor.

## 2019-08-18 NOTE — Assessment & Plan Note (Signed)
Much improved with citalopram 20 mg, no residual side effects. Denies SI/HI. Continue same. Refills sent to pharmacy.

## 2019-08-19 ENCOUNTER — Ambulatory Visit (INDEPENDENT_AMBULATORY_CARE_PROVIDER_SITE_OTHER): Payer: 59 | Admitting: Psychology

## 2019-08-19 DIAGNOSIS — F411 Generalized anxiety disorder: Secondary | ICD-10-CM

## 2019-09-02 ENCOUNTER — Ambulatory Visit (INDEPENDENT_AMBULATORY_CARE_PROVIDER_SITE_OTHER): Payer: 59 | Admitting: Psychology

## 2019-09-02 DIAGNOSIS — F411 Generalized anxiety disorder: Secondary | ICD-10-CM

## 2019-09-07 ENCOUNTER — Other Ambulatory Visit: Payer: Self-pay | Admitting: Primary Care

## 2019-09-07 DIAGNOSIS — F419 Anxiety disorder, unspecified: Secondary | ICD-10-CM

## 2019-09-23 ENCOUNTER — Ambulatory Visit: Payer: 59 | Admitting: Psychology

## 2020-06-20 IMAGING — CT CT HEAD WITHOUT CONTRAST
4 series · 16 of 47 positions shown, 18 images · non-contrast
Comparison: None.

CLINICAL DATA: Transient peripheral vision blurriness on the left
today. Increased anxiety. Increased stress lately.

EXAM:
CT HEAD WITHOUT CONTRAST
TECHNIQUE: Contiguous axial images were obtained from the base of the skull
through the vertex without intravenous contrast.

[Series 3: head wo · axial · 0.44mm/px · z∈[-89,+31]mm · 7 of 34 slices shown, 9 images]
[im 5/34  brain]
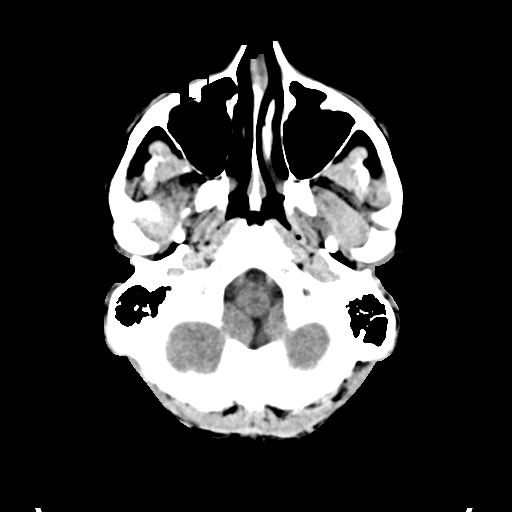
[im 5/34  bone]
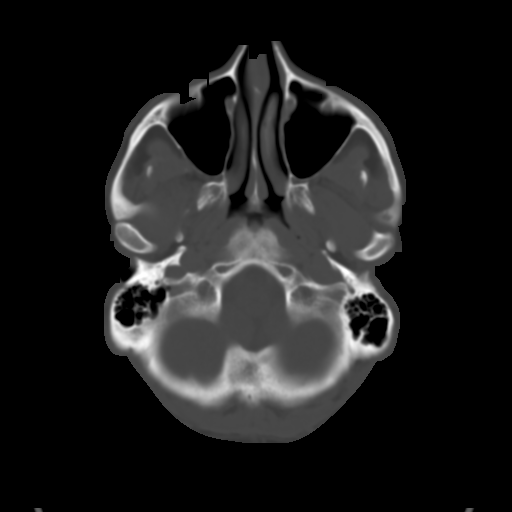
[im 9/34  brain]
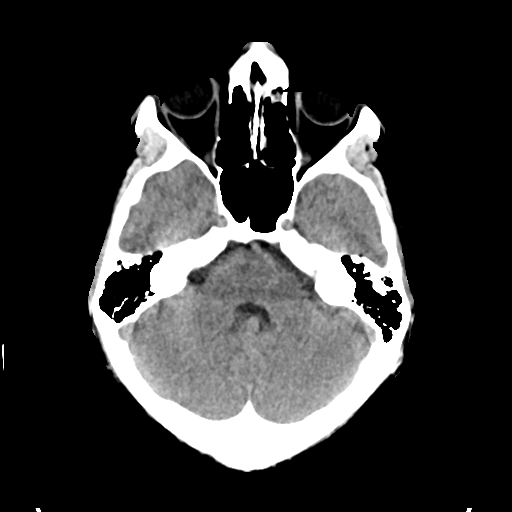
[im 13/34  brain]
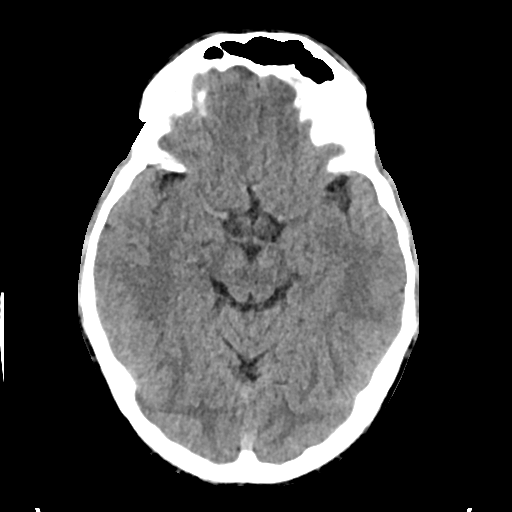
[im 17/34  brain]
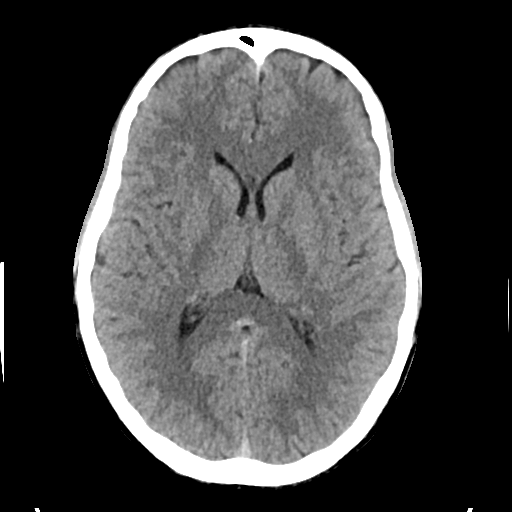
[im 21/34  brain]
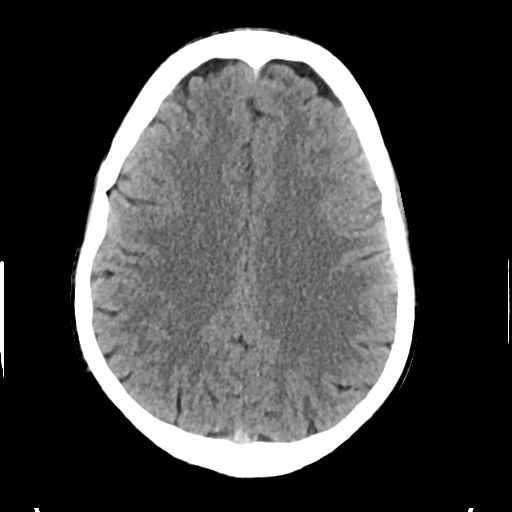
[im 21/34  bone]
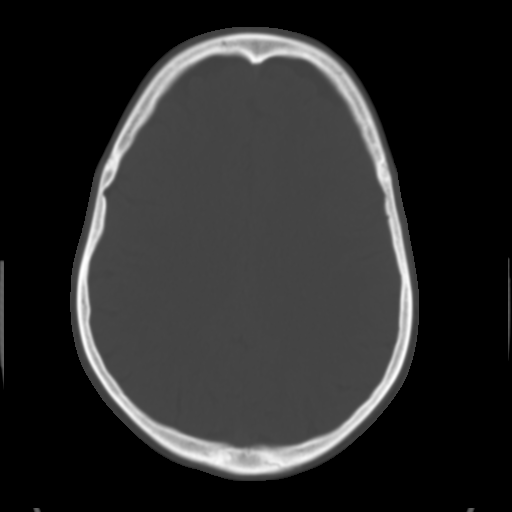
[im 25/34  brain]
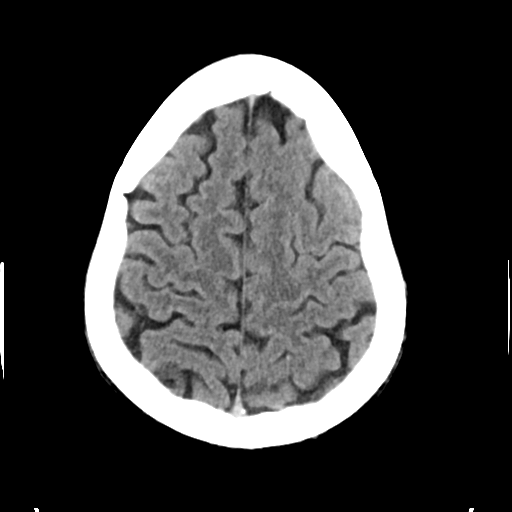
[im 29/34  brain]
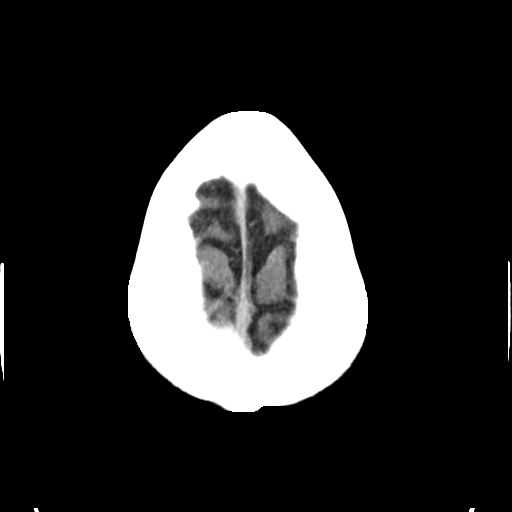

[Series 4: head bone · axial · 0.44mm/px · z∈[-93,-59]mm · 3 of 85 slices shown]
[im 9/85  bone]
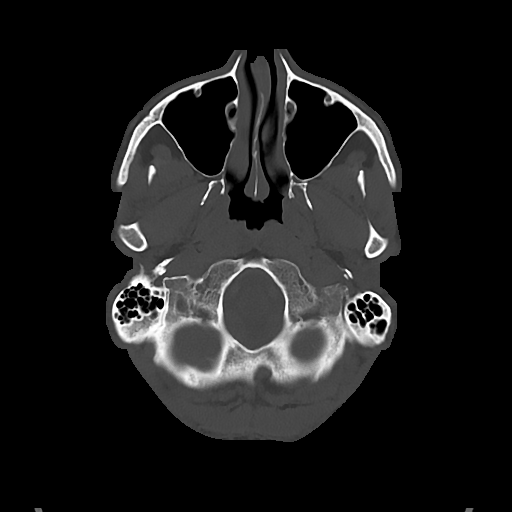
[im 17/85  bone]
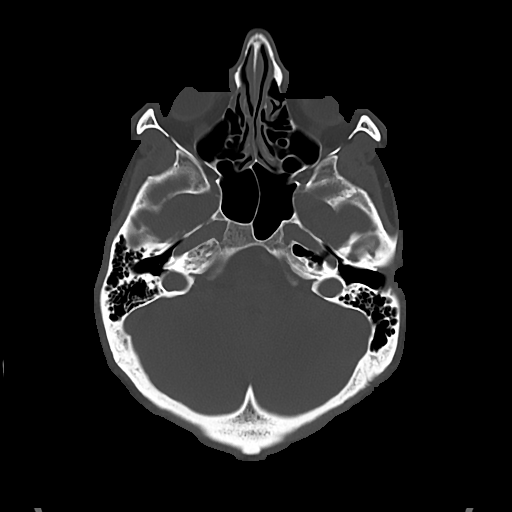
[im 26/85  bone]
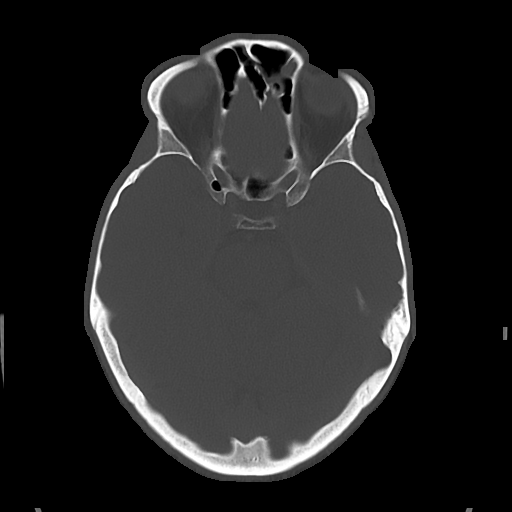

[Series 5: cor soft · coronal · 0.33mm/px · 3 of 75 slices shown]
[im 25/75  brain]
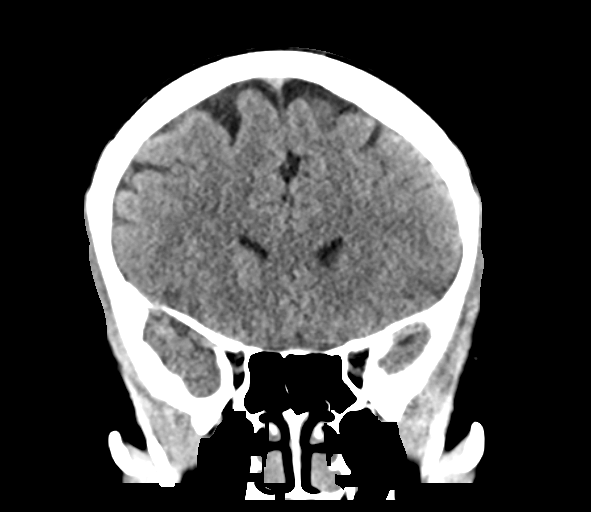
[im 33/75  brain]
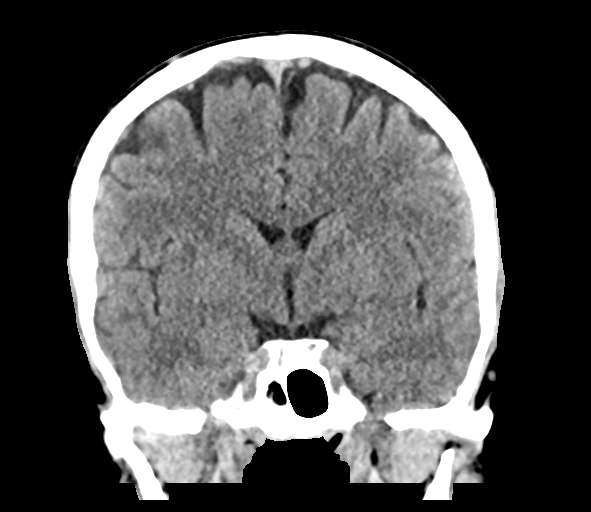
[im 42/75  brain]
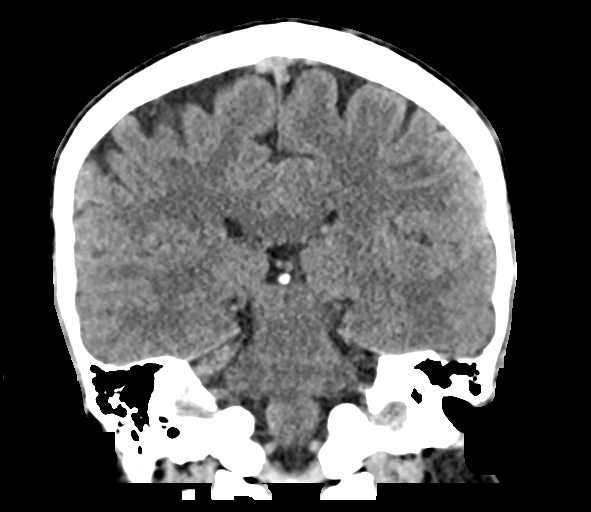

[Series 6: sag soft · sagittal · 0.33mm/px · 3 of 64 slices shown]
[im 22/64  brain]
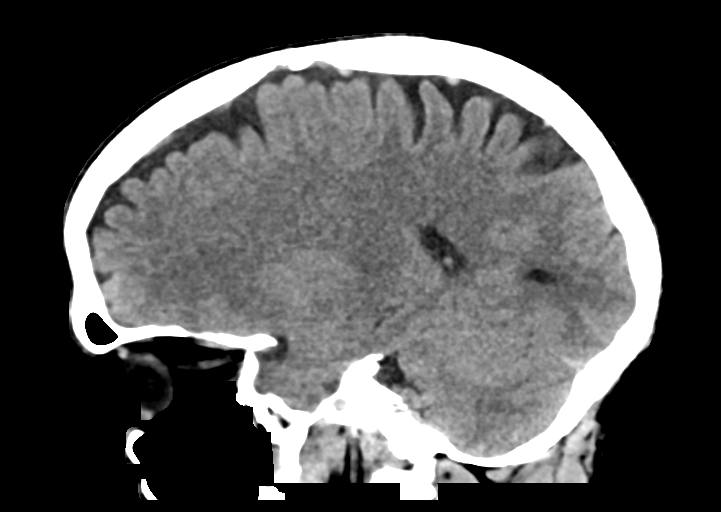
[im 32/64  brain]
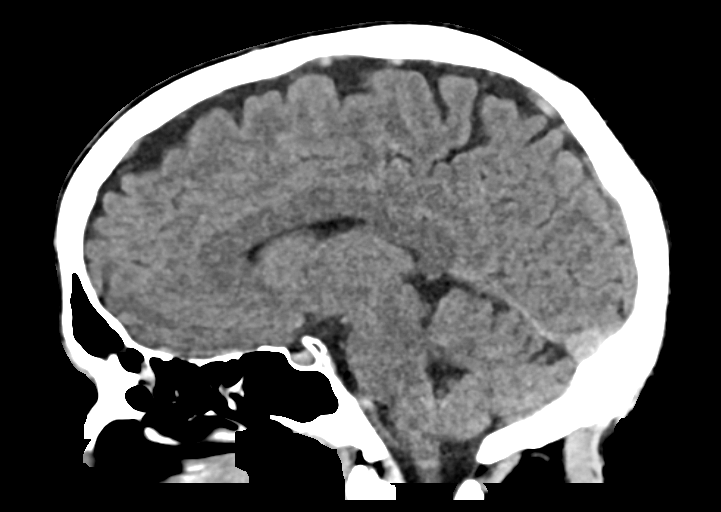
[im 43/64  brain]
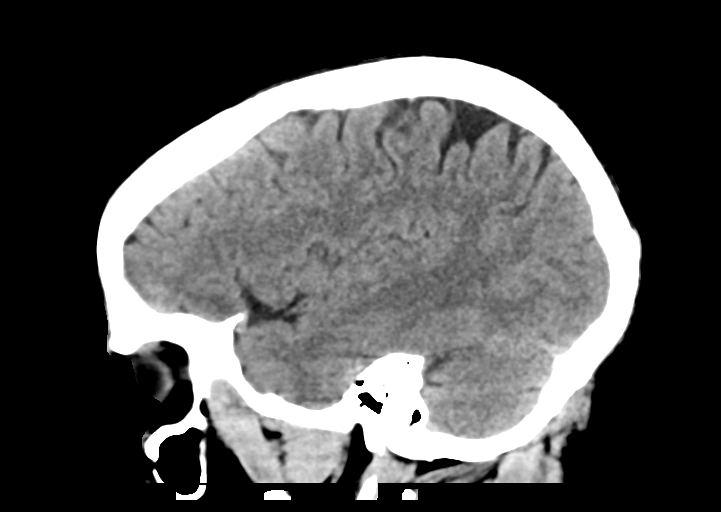

[16 of 47 positions shown; findings below may reference images not displayed]

FINDINGS: Brain: Normal appearing cerebral hemispheres and posterior fossa
structures. Normal size and position of the ventricles. No
intracranial hemorrhage, mass lesion or CT evidence of acute
infarction.

Vascular: No hyperdense vessel or unexpected calcification.

Skull: Normal. Negative for fracture or focal lesion.

Sinuses/Orbits: Mild left maxillary sinus mucosal thickening and
retention cyst formation. Unremarkable orbits.

Other: None.
IMPRESSION: 1. No intracranial abnormality.
2. Mild chronic left maxillary sinusitis.

## 2020-11-20 ENCOUNTER — Telehealth: Payer: Self-pay

## 2020-11-20 NOTE — Telephone Encounter (Signed)
Spoke to pt told him need to have foot evaluated as soon as possible. Asked him if he can go to an Urgent care tonight? Pt said no, he took some Ibuprofen feels a little better. Told pt he should call back in the morning for an appt then if can't get to Urgent care. Please elevate foot this evening. Pt verbalized understanding and will call back in AM.

## 2020-11-20 NOTE — Telephone Encounter (Signed)
Patient needs evaluation soon as possible, whether that be in our office (depending on severity of the wound) versus urgent care.  Please try to get him in with someone in our office if the wound is stable and overall minor.

## 2020-11-20 NOTE — Telephone Encounter (Signed)
Please see message and advise 

## 2020-11-20 NOTE — Telephone Encounter (Signed)
Triage vm from pt's wife, Judeth Cornfield (on dpr), stating pt stepped on a nail yesterday.  The nail went through his shoe into the foot.  Pt foot is really swollen this morning and can barely walk on it.  Judeth Cornfield is asking what pt should do.  Judeth Cornfield can be reached at [phone].   Tdap, 06/15/19

## 2020-11-20 NOTE — Telephone Encounter (Signed)
Spoke with patient, Dr. Reece Agar has openings tomorrow.  Patient not sure that he could make the 12noon appt so put him in at 3pm tomorrow to see Dr. Sharen Hones.    Patient thanked me for the follow up.

## 2020-11-21 ENCOUNTER — Ambulatory Visit: Payer: 59 | Admitting: Family Medicine

## 2020-11-21 ENCOUNTER — Other Ambulatory Visit: Payer: Self-pay

## 2020-11-21 ENCOUNTER — Encounter: Payer: Self-pay | Admitting: Family Medicine

## 2020-11-21 DIAGNOSIS — S91332A Puncture wound without foreign body, left foot, initial encounter: Secondary | ICD-10-CM | POA: Insufficient documentation

## 2020-11-21 MED ORDER — LEVOFLOXACIN 750 MG PO TABS: 750.0000 mg | ORAL_TABLET | Freq: Every day | ORAL | 0 refills | Status: AC

## 2020-11-21 MED ORDER — LEVOFLOXACIN 500 MG PO TABS: 500.0000 mg | ORAL_TABLET | Freq: Every day | ORAL | 0 refills | Status: DC

## 2020-11-21 NOTE — Patient Instructions (Signed)
You are up to date on tetanus  Preventative treatment for infection with levaquin antibiotic take 500mg  once daily for 5 days.  Watch for fevers, streaking redness, nausea, or new symptoms and let know if that happens.   Puncture Wound A puncture wound is an injury that is caused by a sharp, thin object that goes through (penetrates) your skin. Usually, a puncture wound does not leave a large opening in your skin, so it may not bleed a lot. However, when you get a puncture wound, dirt or other materials (foreign bodies) can be forced into your wound and can break off inside. This increases the chance of infection, such as tetanus. There are many sharp, pointed objects that can cause puncture wounds, including teeth, nails, splinters of glass,fishhooks, and needles. Treatment may include washing out the wound with a germ-free (sterile) salt-water solution, having the wound opened surgically to remove a foreign object, closing the wound with stitches (sutures), and covering the wound with antibiotic ointment and a bandage (dressing). Depending on what caused the injury, you may also need a tetanus shot or arabies shot. Follow these instructions at home: Medicines Take or apply over-the-counter and prescription medicines only as told by your health care provider. If you were prescribed an antibiotic medicine, take or apply it as told by your health care provider. Do not stop using the antibiotic even if your condition improves. Bathing Keep the dressing dry as told by your health care provider. Do not take baths, swim, or use a hot tub until your health care provider approves. Ask your health care provider if you may take showers. You may only be allowed to take sponge baths. Wound care  There are many ways to close and cover a wound. For example, a wound can be closed with sutures, skin glue, or adhesive strips. Follow instructions from your health care provider about how to take care of your wound.  Make sure you: Wash your hands with soap and water before and after you change your dressing. If soap and water are not available, use hand sanitizer. Change your dressing as told by your health care provider. Leave sutures, skin glue, or adhesive strips in place. These skin closures may need to stay in place for 2 weeks or longer. If adhesive strip edges start to loosen and curl up, you may trim the loose edges. Do not remove adhesive strips completely unless your health care provider tells you to do that. Clean the wound as told by your health care provider. Do not scratch or pick at the wound. Check your wound every day for signs of infection. Check for: Redness, swelling, or pain. Fluid or blood. Warmth. Pus or a bad smell.  General instructions Raise (elevate) the injured area above the level of your heart while you are sitting or lying down. If your puncture wound is in your foot, ask your health care provider if you need to avoid putting weight on your foot and for how long. Do not use the injured limb to support your body weight until your health care provider says that you can. Use crutches as told by your health care provider. Keep all follow-up visits as told by your health care provider. This is important. Contact a health care provider if: You received a tetanus shot and you have swelling, severe pain, redness, or bleeding at the injection site. You have a fever. Your sutures come out. You notice a bad smell coming from your wound or your dressing.  You notice something coming out of your wound, such as wood or glass. Your pain is not controlled with medicine. You have increased redness, swelling, or pain at the site of your wound. You have fluid, blood, or pus coming from your wound. You notice a change in the color of your skin near your wound. You need to change the dressing frequently due to fluid, blood, or pus draining from your wound. You develop a new rash. You develop  numbness around your wound. You have warmth around your wound. Get help right away if: You develop severe swelling around your wound. Your pain suddenly increases and is severe. You develop painful skin lumps. You have a red streak going away from your wound. The wound is on your hand or foot and you: Cannot properly move a finger or toe. Notice that your fingers or toes look pale or bluish. Summary A puncture wound is an injury that is caused by a sharp, thin object that goes through (penetrates) your skin. Treatment may include washing out the wound, having the wound opened surgically to remove a foreign object, closing the wound with stitches (sutures), and covering the wound with antibiotic ointment and a bandage (dressing). Follow instructions from your health care provider about how to take care of your wound. Contact a health care provider if you have increased redness, swelling, or pain at the site of your wound. Keep all follow-up visits as told by your health care provider. This is important. This information is not intended to replace advice given to you by your health care provider. Make sure you discuss any questions you have with your healthcare provider. Document Revised: 09/20/2019 Document Reviewed: 12/17/2017 Elsevier Patient Education  2022 [employer].

## 2020-11-21 NOTE — Assessment & Plan Note (Signed)
No current infection. Given nail penetrated sole of shoe before going into skin, will treat ppx with abx course, levaquin chosen to help provide coverage for pseudomonas.  Discussed levaquin side effects including risk tendon injury/rupture.  Red flags to update [place] for further evaluation reviewed.

## 2020-11-21 NOTE — Progress Notes (Signed)
Patient ID: Bruce Donath Talbert Cage., male    DOB: 12-30-1985, 35 y.o.   MRN: [id number]  This visit was conducted in person.  BP 112/70   Pulse 86   Temp 98.1 F (36.7 C) (Temporal)   Ht 5\' 5"  (1.651 m)   Wt 151 lb 8 oz (68.7 kg)   SpO2 96%   BMI 25.21 kg/m    CC: stepped on nail Subjective:   HPI: Story Vanvranken. is a 35 y.o. male presenting on 11/21/2020 for Puncture Wound (C/o puncture wound on bottom of left foot. Stepped on nail, 11/19/20.  Had some swelling, improved.  Took ibuprofen.  Tdap, 06/15/19.)   DOI: 11/19/2020 Stepped on plank with 4 nails attached, 1 nail embedded into distal sole of left foot penetrating the sole of shoe.   Hasn't noticed streaking redness, fevers, chills.  Treated at home with ibuprofen 800mg  with benefit.   No h/o diabetes or neuropathy.  No foreign body sensation - nail looked intact when examined after the injury.   UTD Tdap 05/2019.      Relevant past medical, surgical, family and social history reviewed and updated as indicated. Interim medical history since our last visit reviewed. Allergies and medications reviewed and updated. Outpatient Medications Prior to Visit  Medication Sig Dispense Refill   citalopram (CELEXA) 20 MG tablet Take 1 tablet (20 mg total) by mouth daily. For anxiety. 90 tablet 3   No facility-administered medications prior to visit.     Per HPI unless specifically indicated in ROS section below Review of Systems  Objective:  BP 112/70   Pulse 86   Temp 98.1 F (36.7 C) (Temporal)   Ht 5\' 5"  (1.651 m)   Wt 151 lb 8 oz (68.7 kg)   SpO2 96%   BMI 25.21 kg/m   Wt Readings from Last 3 Encounters:  11/21/20 151 lb 8 oz (68.7 kg)  08/18/19 146 lb 12 oz (66.6 kg)  06/15/19 147 lb 8 oz (66.9 kg)      Physical Exam Vitals and nursing note reviewed.  Constitutional:      Appearance: Normal appearance. He is not ill-appearing.  Musculoskeletal:        General: No swelling or tenderness. Normal range  of motion.     Right lower leg: No edema.     Left lower leg: No edema.     Comments: 2+ DP bilaterally  Skin:    General: Skin is warm and dry.     Findings: No erythema or rash.     Comments:  Small puncture wound distal left sole without surrounding erythema No foreign object appreciated  Neurological:     Mental Status: He is alert.       Assessment & Plan:  This visit occurred during the SARS-CoV-2 public health emergency.  Safety protocols were in place, including screening questions prior to the visit, additional usage of staff PPE, and extensive cleaning of exam room while observing appropriate contact time as indicated for disinfecting solutions.   Problem List Items Addressed This Visit     Puncture wound of left foot without foreign body    No current infection. Given nail penetrated sole of shoe before going into skin, will treat ppx with abx course, levaquin chosen to help provide coverage for pseudomonas.  Discussed levaquin side effects including risk tendon injury/rupture.  Red flags to update 11/23/20 for further evaluation reviewed.          Meds ordered this  encounter  Medications   DISCONTD: levofloxacin (LEVAQUIN) 500 MG tablet    Sig: Take 1 tablet (500 mg total) by mouth daily.    Dispense:  5 tablet    Refill:  0   levofloxacin (LEVAQUIN) 750 MG tablet    Sig: Take 1 tablet (750 mg total) by mouth daily.    Dispense:  5 tablet    Refill:  0    Use 750mg  dose instead of 500mg  dose    No orders of the defined types were placed in this encounter.    Patient instructions: You are up to date on tetanus  Preventative treatment for infection with levaquin antibiotic take 500mg  once daily for 5 days.  Watch for fevers, streaking redness, nausea, or new symptoms and let know if that happens.   Follow up plan: Return if symptoms worsen or fail to improve.  , MD
